# Patient Record
Sex: Female | Born: 1995 | Race: Black or African American | Hispanic: No | Marital: Single | Smoking: Current every day smoker
Health system: Southern US, Community
[De-identification: ages and names within clinical notes are randomized; demographics above are authoritative.]

---

## 2014-05-29 ENCOUNTER — Encounter (HOSPITAL_BASED_OUTPATIENT_CLINIC_OR_DEPARTMENT_OTHER): Payer: Self-pay | Admitting: *Deleted

## 2014-05-29 ENCOUNTER — Emergency Department (HOSPITAL_BASED_OUTPATIENT_CLINIC_OR_DEPARTMENT_OTHER)
Admission: EM | Admit: 2014-05-29 | Discharge: 2014-05-29 | Disposition: A | Discharge status: Home / Self Care | Payer: Medicaid Other | Attending: Emergency Medicine | Admitting: Emergency Medicine

## 2014-05-29 DIAGNOSIS — K5904 Chronic idiopathic constipation: Secondary | ICD-10-CM

## 2014-05-29 DIAGNOSIS — K5900 Constipation, unspecified: Secondary | ICD-10-CM | POA: Diagnosis not present

## 2014-05-29 DIAGNOSIS — Z720 Tobacco use: Secondary | ICD-10-CM | POA: Insufficient documentation

## 2014-05-29 DIAGNOSIS — R1030 Lower abdominal pain, unspecified: Secondary | ICD-10-CM | POA: Diagnosis present

## 2014-05-29 DIAGNOSIS — Z3202 Encounter for pregnancy test, result negative: Secondary | ICD-10-CM | POA: Diagnosis not present

## 2014-05-29 LAB — URINALYSIS, ROUTINE W REFLEX MICROSCOPIC
BILIRUBIN URINE: NEGATIVE
Glucose, UA: NEGATIVE mg/dL
Hgb urine dipstick: NEGATIVE
Ketones, ur: NEGATIVE mg/dL
Nitrite: NEGATIVE
PH: 7 (ref 5.0–8.0)
Protein, ur: NEGATIVE mg/dL
Specific Gravity, Urine: 1.023 (ref 1.005–1.030)
Urobilinogen, UA: 0.2 mg/dL (ref 0.0–1.0)

## 2014-05-29 LAB — URINE MICROSCOPIC-ADD ON

## 2014-05-29 LAB — PREGNANCY, URINE: Preg Test, Ur: NEGATIVE

## 2014-05-29 MED ORDER — IBUPROFEN 800 MG PO TABS
800.0000 mg | ORAL_TABLET | Freq: Once | ORAL | Status: AC
  Administered 2014-05-29: 800 mg via ORAL
  Filled 2014-05-29: qty 1

## 2014-05-29 MED ORDER — DICYCLOMINE HCL 10 MG PO CAPS
10.0000 mg | ORAL_CAPSULE | Freq: Once | ORAL | Status: AC
  Administered 2014-05-29: 10 mg via ORAL
  Filled 2014-05-29: qty 1

## 2014-05-29 MED ORDER — METOCLOPRAMIDE HCL 10 MG PO TABS
10.0000 mg | ORAL_TABLET | Freq: Once | ORAL | Status: AC
  Administered 2014-05-29: 10 mg via ORAL
  Filled 2014-05-29: qty 1

## 2014-05-29 NOTE — ED Notes (Signed)
Pt is here for lower abdominal pain and headache.  No GU or GYN symptoms with this, no n/v or diarrhea.  Symptoms x3 days

## 2014-05-29 NOTE — ED Provider Notes (Signed)
CSN: [id number]     Arrival date & time 05/29/14  0052 History   First MD Initiated Contact with Patient 05/29/14 0145     Chief Complaint  Patient presents with  . Abdominal Pain     (Consider location/radiation/quality/duration/timing/severity/associated sxs/prior Treatment) Patient is a 19 y.o. female presenting with abdominal pain. The history is provided by the patient.  Abdominal Pain Pain location:  Suprapubic Pain quality: cramping   Pain radiates to:  Does not radiate Pain severity:  Mild Onset quality:  Gradual Timing:  Intermittent Progression:  Unchanged Chronicity:  New Context comment:  Passing four stools of normal consistency.  No GU norGYN complaints Relieved by:  Nothing Worsened by:  Nothing tried Ineffective treatments:  None tried Associated symptoms: no anorexia, no dysuria, no vaginal bleeding and no vaginal discharge   Risk factors: not pregnant     History reviewed. No pertinent past medical history. History reviewed. No pertinent past surgical history. History reviewed. No pertinent family history. History  Substance Use Topics  . Smoking status: Current Every Day Smoker -- 0.00 packs/day  . Smokeless tobacco: Not on file  . Alcohol Use: No   OB History    No data available     Review of Systems  Gastrointestinal: Positive for abdominal pain. Negative for anorexia.  Genitourinary: Negative for dysuria, vaginal bleeding and vaginal discharge.  All other systems reviewed and are negative.     Allergies  Review of patient's allergies indicates no known allergies.  Home Medications   Prior to Admission medications   Not on File   BP 126/81 mmHg  Pulse 93  Temp(Src) 98.4 F (36.9 C) (Oral)  Resp 16  Wt 201 lb 1.6 oz (91.218 kg)  SpO2 99%  LMP 05/06/2014 Physical Exam  Constitutional: She is oriented to person, place, and time. She appears well-developed and well-nourished. No distress.  HENT:  Head: Normocephalic and atraumatic.   Mouth/Throat: Oropharynx is clear and moist.  Eyes: Conjunctivae are normal. Pupils are equal, round, and reactive to light.  Neck: Normal range of motion. Neck supple.  Cardiovascular: Normal rate, regular rhythm and intact distal pulses.   Pulmonary/Chest: Effort normal and breath sounds normal. She has no wheezes. She has no rales.  Abdominal: Soft. She exhibits no mass. Bowel sounds are increased. There is no tenderness. There is no rigidity, no rebound, no guarding, no tenderness at McBurney's point and negative Murphy's sign.  Palpable stool throughout the colon  Musculoskeletal: Normal range of motion.  Neurological: She is alert and oriented to person, place, and time. She has normal reflexes. No cranial nerve deficit.  Skin: Skin is warm and dry.  Psychiatric: She has a normal mood and affect.    ED Course  Procedures (including critical care time) Labs Review Labs Reviewed  URINALYSIS, ROUTINE W REFLEX MICROSCOPIC - Abnormal; Notable for the following:    Leukocytes, UA SMALL (*)    All other components within normal limits  URINE MICROSCOPIC-ADD ON - Abnormal; Notable for the following:    Bacteria, UA FEW (*)    All other components within normal limits  PREGNANCY, URINE    Imaging Review No results found.   EKG Interpretation None      MDM   Final diagnoses:  Constipation - functional  Here with several other family    Patient repeatedly asked for a urine pregnancy result.  This is negative.  Palpable constipation recommend miralax.  Feels improved in the ED.  Patient is stable for  discharge at this time    Cooper Stamp Smitty Cords, MD 05/29/14 [phone]

## 2014-05-29 NOTE — Discharge Instructions (Signed)

## 2014-10-13 ENCOUNTER — Encounter (HOSPITAL_BASED_OUTPATIENT_CLINIC_OR_DEPARTMENT_OTHER): Payer: Self-pay | Admitting: *Deleted

## 2014-10-13 ENCOUNTER — Emergency Department (HOSPITAL_BASED_OUTPATIENT_CLINIC_OR_DEPARTMENT_OTHER)
Admission: EM | Admit: 2014-10-13 | Discharge: 2014-10-13 | Disposition: A | Discharge status: Home / Self Care | Payer: Medicaid Other | Attending: Emergency Medicine | Admitting: Emergency Medicine

## 2014-10-13 DIAGNOSIS — N3091 Cystitis, unspecified with hematuria: Secondary | ICD-10-CM | POA: Insufficient documentation

## 2014-10-13 DIAGNOSIS — N3090 Cystitis, unspecified without hematuria: Secondary | ICD-10-CM

## 2014-10-13 DIAGNOSIS — Z3202 Encounter for pregnancy test, result negative: Secondary | ICD-10-CM | POA: Insufficient documentation

## 2014-10-13 DIAGNOSIS — R569 Unspecified convulsions: Secondary | ICD-10-CM | POA: Insufficient documentation

## 2014-10-13 LAB — URINALYSIS, ROUTINE W REFLEX MICROSCOPIC
BILIRUBIN URINE: NEGATIVE
Glucose, UA: NEGATIVE mg/dL
HGB URINE DIPSTICK: NEGATIVE
KETONES UR: 15 mg/dL — AB
Nitrite: NEGATIVE
PH: 6 (ref 5.0–8.0)
Protein, ur: NEGATIVE mg/dL
SPECIFIC GRAVITY, URINE: 1.027 (ref 1.005–1.030)
UROBILINOGEN UA: 0.2 mg/dL (ref 0.0–1.0)

## 2014-10-13 LAB — PREGNANCY, URINE: PREG TEST UR: NEGATIVE

## 2014-10-13 LAB — URINE MICROSCOPIC-ADD ON

## 2014-10-13 MED ORDER — CIPROFLOXACIN HCL 250 MG PO TABS: 250.0000 mg | ORAL_TABLET | Freq: Two times a day (BID) | ORAL | Status: DC

## 2014-10-13 NOTE — ED Notes (Signed)
Dysuria. Lower abdominal pain and frequency x 2 days. No vaginal discharge.

## 2014-10-13 NOTE — ED Notes (Signed)
Pt directed to pharmacy to pick up meds 

## 2014-10-13 NOTE — Discharge Instructions (Signed)

## 2014-10-13 NOTE — ED Provider Notes (Signed)
CSN: [id number]     Arrival date & time 10/13/14  1100 History   First MD Initiated Contact with Patient 10/13/14 1110     Chief Complaint  Patient presents with  . Dysuria     (Consider location/radiation/quality/duration/timing/severity/associated sxs/prior Treatment) HPI Comments: Pt. Is a 19 y/o AAF here with dysuria, hematuria, frequency, urgency, and low abdominal pain for the past two days. These symptoms are consistent with a previous urinary tract infection that she had. She denies fever, chills, nausea, vomiting. She says she saw a small amount of blood on the tissue with wiping, but no blood in the bowl. She has not been on her period. She has not had constipation or diarrhea. She has not had vaginal discharge, pain, itching, or burning. She has no other medical problems.   The history is provided by the patient.    History reviewed. No pertinent past medical history. History reviewed. No pertinent past surgical history. No family history on file. History  Substance Use Topics  . Smoking status: Never Smoker   . Smokeless tobacco: Not on file  . Alcohol Use: Yes     Comment: occ   OB History    No data available     Review of Systems  Constitutional: Negative.  Negative for fever, chills and fatigue.  HENT: Negative.   Eyes: Negative.   Respiratory: Negative.   Cardiovascular: Negative for chest pain, palpitations and leg swelling.  Gastrointestinal: Positive for abdominal pain. Negative for nausea, vomiting, diarrhea, constipation and abdominal distention.  Endocrine: Positive for polyuria. Negative for polydipsia.  Genitourinary: Positive for dysuria, urgency, frequency, hematuria and flank pain. Negative for decreased urine volume, vaginal bleeding, vaginal discharge, difficulty urinating, vaginal pain, menstrual problem, pelvic pain and dyspareunia.  Musculoskeletal: Positive for back pain. Negative for myalgias.  Skin: Negative for color change and rash.   Allergic/Immunologic: Negative.   Neurological: Positive for seizures. Negative for dizziness, light-headedness, numbness and headaches.  Hematological: Negative.   Psychiatric/Behavioral: Negative.       Allergies  Review of patient's allergies indicates no known allergies.  Home Medications   Prior to Admission medications   Medication Sig Start Date End Date Taking? Authorizing Provider  ciprofloxacin (CIPRO) 250 MG tablet Take 1 tablet (250 mg total) by mouth 2 (two) times daily. 10/13/14   Hillery Hunter Raequan Vanschaick, MD   BP 114/73 mmHg  Pulse 68  Temp(Src) 98 F (36.7 C) (Oral)  Resp 20  Ht  (1.702 m)  Wt 201 lb (91.173 kg)  BMI 31.47 kg/m2  SpO2 100%  LMP 09/11/2014 Physical Exam  Constitutional: She is oriented to person, place, and time. She appears well-developed and well-nourished. No distress.  HENT:  Head: Normocephalic and atraumatic.  Eyes: Conjunctivae and EOM are normal. Pupils are equal, round, and reactive to light.  Neck: Normal range of motion. Neck supple.  Cardiovascular: Normal rate, regular rhythm, normal heart sounds and intact distal pulses.  Exam reveals no gallop and no friction rub.   No murmur heard. Pulmonary/Chest: Effort normal and breath sounds normal. No respiratory distress. She has no wheezes. She has no rales. She exhibits no tenderness.  Abdominal: Soft. Bowel sounds are normal. She exhibits no distension and no mass. There is no hepatosplenomegaly. There is tenderness in the suprapubic area. There is CVA tenderness. There is no rebound and no guarding.  Very Mild CVA tenderness on the right.   Musculoskeletal: Normal range of motion. She exhibits no edema or tenderness.  Neurological: She  is alert and oriented to person, place, and time.  Skin: Skin is warm and dry. No rash noted. She is not diaphoretic. No erythema.  Psychiatric: She has a normal mood and affect. Her behavior is normal.    ED Course  Procedures (including critical  care time) Labs Review Labs Reviewed  URINALYSIS, ROUTINE W REFLEX MICROSCOPIC (NOT AT [facility]) - Abnormal; Notable for the following:    APPearance CLOUDY (*)    Ketones, ur 15 (*)    Leukocytes, UA MODERATE (*)    All other components within normal limits  URINE MICROSCOPIC-ADD ON - Abnormal; Notable for the following:    Bacteria, UA MANY (*)    All other components within normal limits  URINE CULTURE  PREGNANCY, URINE    Imaging Review No results found.   EKG Interpretation None      MDM   Final diagnoses:  Cystitis    Pt. Is a 19 y/o F here with uncomplicated cystits vs. Early pyelonephritis. No fever, no nausea or vomiting. Urinalysis with evidence of urinary tract infection. Culture sent. Will give Ciprofloxacin for Cystitis vs. Early pyelonephritis and d/c home with recommendation to follow up with PCP as an outpatient.     Yolande Jollyaleb G Myldred Raju, MD 10/13/14 1237  Elwin MochaBlair Walden, MD 10/13/14 [phone]

## 2014-10-15 LAB — URINE CULTURE: SPECIAL REQUESTS: NORMAL

## 2015-10-15 ENCOUNTER — Emergency Department (HOSPITAL_BASED_OUTPATIENT_CLINIC_OR_DEPARTMENT_OTHER): Payer: Self-pay

## 2015-10-15 ENCOUNTER — Encounter (HOSPITAL_BASED_OUTPATIENT_CLINIC_OR_DEPARTMENT_OTHER): Payer: Self-pay | Admitting: *Deleted

## 2015-10-15 ENCOUNTER — Emergency Department (HOSPITAL_BASED_OUTPATIENT_CLINIC_OR_DEPARTMENT_OTHER)
Admission: EM | Admit: 2015-10-15 | Discharge: 2015-10-15 | Disposition: A | Discharge status: Home / Self Care | Payer: Self-pay | Attending: Emergency Medicine | Admitting: Emergency Medicine

## 2015-10-15 DIAGNOSIS — S61411A Laceration without foreign body of right hand, initial encounter: Secondary | ICD-10-CM | POA: Insufficient documentation

## 2015-10-15 DIAGNOSIS — Y9229 Other specified public building as the place of occurrence of the external cause: Secondary | ICD-10-CM | POA: Insufficient documentation

## 2015-10-15 DIAGNOSIS — W25XXXA Contact with sharp glass, initial encounter: Secondary | ICD-10-CM | POA: Insufficient documentation

## 2015-10-15 DIAGNOSIS — Y939 Activity, unspecified: Secondary | ICD-10-CM | POA: Insufficient documentation

## 2015-10-15 DIAGNOSIS — Y999 Unspecified external cause status: Secondary | ICD-10-CM | POA: Insufficient documentation

## 2015-10-15 LAB — PREGNANCY, URINE: Preg Test, Ur: NEGATIVE

## 2015-10-15 MED ORDER — AMOXICILLIN-POT CLAVULANATE 875-125 MG PO TABS: 1.0000 | ORAL_TABLET | Freq: Two times a day (BID) | ORAL | Status: DC

## 2015-10-15 MED ORDER — LIDOCAINE HCL 2 % IJ SOLN
10.0000 mL | Freq: Once | INTRAMUSCULAR | Status: AC
  Administered 2015-10-15: 200 mg
  Filled 2015-10-15: qty 20

## 2015-10-15 MED ORDER — DICLOFENAC SODIUM ER 100 MG PO TB24: 100.0000 mg | ORAL_TABLET | Freq: Every day | ORAL | Status: DC

## 2015-10-15 MED ORDER — NAPROXEN 250 MG PO TABS
500.0000 mg | ORAL_TABLET | Freq: Once | ORAL | Status: AC
  Administered 2015-10-15: 500 mg via ORAL
  Filled 2015-10-15: qty 2

## 2015-10-15 MED ORDER — TETANUS-DIPHTH-ACELL PERTUSSIS 5-2.5-18.5 LF-MCG/0.5 IM SUSP
0.5000 mL | Freq: Once | INTRAMUSCULAR | Status: AC
  Administered 2015-10-15: 0.5 mL via INTRAMUSCULAR
  Filled 2015-10-15: qty 0.5

## 2015-10-15 MED ORDER — AMOXICILLIN-POT CLAVULANATE 875-125 MG PO TABS
1.0000 | ORAL_TABLET | Freq: Once | ORAL | Status: AC
  Administered 2015-10-15: 1 via ORAL
  Filled 2015-10-15: qty 1

## 2015-10-15 NOTE — ED Notes (Addendum)
MD at bedside suturing
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
## 2015-10-15 NOTE — ED Notes (Signed)
Non-sutured wounds covered with bacitracin. All wounds covered with non-adherent dressing, padded with gauze and wrapped with kerlex. Pt verbalized understanding in how to redress wound tomorrow. Wound dressing supplies provided.
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
## 2015-10-15 NOTE — ED Notes (Signed)
Patient transported to X-ray 

## 2015-10-15 NOTE — Discharge Instructions (Signed)
Laceration Care, Adult  A laceration is a cut that goes through all layers of the skin. The cut also goes into the tissue that is right under the skin. Some cuts heal on their own. Others need to be closed with stitches (sutures), staples, skin adhesive strips, or wound glue. Taking care of your cut lowers your risk of infection and helps your cut to heal better.  HOW TO TAKE CARE OF YOUR CUT  For stitches or staples:  · Keep the wound clean and dry.  · If you were given a bandage (dressing), you should change it at least one time per day or as told by your doctor. You should also change it if it gets wet or dirty.  · Keep the wound completely dry for the first 24 hours or as told by your doctor. After that time, you may take a shower or a bath. However, make sure that the wound is not soaked in water until after the stitches or staples have been removed.  · Clean the wound one time each day or as told by your doctor:    Wash the wound with soap and water.    Rinse the wound with water until all of the soap comes off.    Pat the wound dry with a clean towel. Do not rub the wound.  · After you clean the wound, put a thin layer of antibiotic ointment on it as told by your doctor. This ointment:    Helps to prevent infection.    Keeps the bandage from sticking to the wound.  · Have your stitches or staples removed as told by your doctor.  If your doctor used skin adhesive strips:   · Keep the wound clean and dry.  · If you were given a bandage, you should change it at least one time per day or as told by your doctor. You should also change it if it gets dirty or wet.  · Do not get the skin adhesive strips wet. You can take a shower or a bath, but be careful to keep the wound dry.  · If the wound gets wet, pat it dry with a clean towel. Do not rub the wound.  · Skin adhesive strips fall off on their own. You can trim the strips as the wound heals. Do not remove any strips that are still stuck to the wound. They will  fall off after a while.  If your doctor used wound glue:  · Try to keep your wound dry, but you may briefly wet it in the shower or bath. Do not soak the wound in water, such as by swimming.  · After you take a shower or a bath, gently pat the wound dry with a clean towel. Do not rub the wound.  · Do not do any activities that will make you really sweaty until the skin glue has fallen off on its own.  · Do not apply liquid, cream, or ointment medicine to your wound while the skin glue is still on.  · If you were given a bandage, you should change it at least one time per day or as told by your doctor. You should also change it if it gets dirty or wet.  · If a bandage is placed over the wound, do not let the tape for the bandage touch the skin glue.  · Do not pick at the glue. The skin glue usually stays on for 5-10 days. Then, it   falls off of the skin.  General Instructions   · To help prevent scarring, make sure to cover your wound with sunscreen whenever you are outside after stitches are removed, after adhesive strips are removed, or when wound glue stays in place and the wound is healed. Make sure to wear a sunscreen of at least 30 SPF.  · Take over-the-counter and prescription medicines only as told by your doctor.  · If you were given antibiotic medicine or ointment, take or apply it as told by your doctor. Do not stop using the antibiotic even if your wound is getting better.  · Do not scratch or pick at the wound.  · Keep all follow-up visits as told by your doctor. This is important.  · Check your wound every day for signs of infection. Watch for:    Redness, swelling, or pain.    Fluid, blood, or pus.  · Raise (elevate) the injured area above the level of your heart while you are sitting or lying down, if possible.  GET HELP IF:  · You got a tetanus shot and you have any of these problems at the injection site:    Swelling.    Very bad pain.    Redness.    Bleeding.  · You have a fever.  · A wound that was  closed breaks open.  · You notice a bad smell coming from your wound or your bandage.  · You notice something coming out of the wound, such as wood or glass.  · Medicine does not help your pain.  · You have more redness, swelling, or pain at the site of your wound.  · You have fluid, blood, or pus coming from your wound.  · You notice a change in the color of your skin near your wound.  · You need to change the bandage often because fluid, blood, or pus is coming from the wound.  · You start to have a new rash.  · You start to have numbness around the wound.  GET HELP RIGHT AWAY IF:  · You have very bad swelling around the wound.  · Your pain suddenly gets worse and is very bad.  · You notice painful lumps near the wound or on skin that is anywhere on your body.  · You have a red streak going away from your wound.  · The wound is on your hand or foot and you cannot move a finger or toe like you usually can.  · The wound is on your hand or foot and you notice that your fingers or toes look pale or bluish.     This information is not intended to replace advice given to you by your health care provider. Make sure you discuss any questions you have with your health care provider.     Document Released: 09/04/2007 Document Revised: 08/02/2014 Document Reviewed: 03/14/2014  [employer] Interactive Patient Education ©2016 [employer]

## 2015-12-07 ENCOUNTER — Encounter (HOSPITAL_BASED_OUTPATIENT_CLINIC_OR_DEPARTMENT_OTHER): Payer: Self-pay | Admitting: *Deleted

## 2015-12-07 ENCOUNTER — Emergency Department (HOSPITAL_BASED_OUTPATIENT_CLINIC_OR_DEPARTMENT_OTHER)
Admission: EM | Admit: 2015-12-07 | Discharge: 2015-12-07 | Disposition: A | Discharge status: Home / Self Care | Payer: Medicaid Other | Attending: Emergency Medicine | Admitting: Emergency Medicine

## 2015-12-07 DIAGNOSIS — B373 Candidiasis of vulva and vagina: Secondary | ICD-10-CM | POA: Insufficient documentation

## 2015-12-07 DIAGNOSIS — B3731 Acute candidiasis of vulva and vagina: Secondary | ICD-10-CM

## 2015-12-07 DIAGNOSIS — F17200 Nicotine dependence, unspecified, uncomplicated: Secondary | ICD-10-CM | POA: Insufficient documentation

## 2015-12-07 LAB — WET PREP, GENITAL
Clue Cells Wet Prep HPF POC: NONE SEEN
Sperm: NONE SEEN
TRICH WET PREP: NONE SEEN

## 2015-12-07 LAB — URINALYSIS, ROUTINE W REFLEX MICROSCOPIC
Bilirubin Urine: NEGATIVE
GLUCOSE, UA: NEGATIVE mg/dL
Hgb urine dipstick: NEGATIVE
Ketones, ur: NEGATIVE mg/dL
Nitrite: NEGATIVE
Protein, ur: NEGATIVE mg/dL
SPECIFIC GRAVITY, URINE: 1.026 (ref 1.005–1.030)
pH: 6.5 (ref 5.0–8.0)

## 2015-12-07 LAB — URINE MICROSCOPIC-ADD ON

## 2015-12-07 LAB — PREGNANCY, URINE: PREG TEST UR: NEGATIVE

## 2015-12-07 MED ORDER — FLUCONAZOLE 50 MG PO TABS
150.0000 mg | ORAL_TABLET | Freq: Every day | ORAL | Status: DC
  Administered 2015-12-07: 150 mg via ORAL
  Filled 2015-12-07: qty 1

## 2015-12-07 NOTE — ED Provider Notes (Signed)
[facility] Provider Note   CSN: [id number] Arrival date & time: 12/07/15  1959     History   Chief Complaint Chief Complaint  Patient presents with  . Vaginal Pain    HPI Christina Maddox is a 20 y.o. female with recent treatment for trichomonas presenting for vaginal discharge and itching. Patient states that she's had 2 day history of vaginal discharge and itching. Vaginal pain from itching. She states that she has white vaginal discharge. She recently treated for Trichomonas received metronidazole and took this to completion. Denies dysuria, freq, urgency. Denies fevers, chills, nausea, or vomiting.  Have not been sexual active in the last two weeks, with previous partner whom she had been active with.   HPI  History reviewed. No pertinent past medical history.  There are no active problems to display for this patient.   History reviewed. No pertinent surgical history.  OB History    No data available       Home Medications    Prior to Admission medications   Medication Sig Start Date End Date Taking? Authorizing Provider  amoxicillin-clavulanate (AUGMENTIN) 875-125 MG tablet Take 1 tablet by mouth 2 (two) times daily. One po bid x 7 days 10/15/15   April Palumbo, MD  ciprofloxacin (CIPRO) 250 MG tablet Take 1 tablet (250 mg total) by mouth 2 (two) times daily. 10/13/14   Yolande Jollyaleb G Melancon, MD  Diclofenac Sodium CR (VOLTAREN-XR) 100 MG 24 hr tablet Take 1 tablet (100 mg total) by mouth daily. 10/15/15   April Palumbo, MD    Family History No family history on file.  Social History Social History  Substance Use Topics  . Smoking status: Current Every Day Smoker    Packs/day: 0.00  . Smokeless tobacco: Never Used  . Alcohol use Yes     Comment: occ     Allergies   Review of patient's allergies indicates no known allergies.   Review of Systems Review of Systems  Constitutional: Negative for chills and fever.  HENT: Negative for congestion.     Eyes: Negative for visual disturbance.  Respiratory: Negative for shortness of breath.   Gastrointestinal: Negative for abdominal distention, abdominal pain, nausea and vomiting.  Genitourinary: Positive for vaginal discharge and vaginal pain. Negative for dysuria, frequency and urgency.  Neurological: Negative for dizziness.     Physical Exam Updated Vital Signs BP 120/65 (BP Location: Right Arm)   Pulse 88   Temp 97.4 F (36.3 C) (Oral)   Resp 18   Ht 5\' 8"  (1.727 m)   Wt 89.8 kg   LMP 11/22/2015   SpO2 100%   BMI 30.11 kg/m   Physical Exam  Constitutional: She is oriented to person, place, and time. She appears well-developed and well-nourished.  HENT:  Head: Normocephalic and atraumatic.  Mouth/Throat: Oropharynx is clear and moist.  Eyes: Conjunctivae are normal.  Neck: Normal range of motion. Neck supple.  Cardiovascular: Normal rate, regular rhythm, normal heart sounds and intact distal pulses.   Pulmonary/Chest: Effort normal and breath sounds normal.  Abdominal: Soft. Bowel sounds are normal.  Genitourinary: Uterus normal. Cervix exhibits no motion tenderness and no discharge. Right adnexum displays no mass and no tenderness. Left adnexum displays no mass and no tenderness. No tenderness in the vagina. Vaginal discharge found.  Musculoskeletal: Normal range of motion.  Neurological: She is alert and oriented to person, place, and time.  Skin: Skin is warm and dry.  Psychiatric: She has a normal mood and affect.  ED Treatments / Results  Labs (all labs ordered are listed, but only abnormal results are displayed) Labs Reviewed  WET PREP, GENITAL - Abnormal; Notable for the following:       Result Value   Yeast Wet Prep HPF POC PRESENT (*)    WBC, Wet Prep HPF POC MANY (*)    All other components within normal limits  URINALYSIS, ROUTINE W REFLEX MICROSCOPIC (NOT AT [facility]) - Abnormal; Notable for the following:    APPearance CLOUDY (*)    Leukocytes, UA  LARGE (*)    All other components within normal limits  URINE MICROSCOPIC-ADD ON - Abnormal; Notable for the following:    Squamous Epithelial / LPF 0-5 (*)    Bacteria, UA RARE (*)    All other components within normal limits  PREGNANCY, URINE  RPR  HIV ANTIBODY (ROUTINE TESTING)  GC/CHLAMYDIA PROBE AMP ([place]) NOT AT [facility]    EKG  EKG Interpretation None       Radiology No results found.  Procedures Procedures (including critical care time)  Medications Ordered in ED Medications  fluconazole (DIFLUCAN) tablet 150 mg (150 mg Oral Given 12/07/15 2213)     Initial Impression / Assessment and Plan / ED Course  I have reviewed the triage vital signs and the nursing notes.  Pertinent labs & imaging results that were available during my care of the patient were reviewed by me and considered in my medical decision making (see chart for details).  Clinical Course   Patient presenting with 3 day history of vaginal discharge and irritation, recent STD treatment about 2 weeks ago. No signs of systemic symptoms.  Obtained a wet prep, GC, HIV, RPR. Positive for yeast on wet prep, provided treatment.   Final Clinical Impressions(s) / ED Diagnoses   Final diagnoses:  Vaginal candida    New Prescriptions Discharge Medication List as of 12/07/2015  9:59 PM       Chevi Lim Mayra Reel, MD 12/07/15 2324    Rolan Bucco, MD 12/07/15 2327

## 2015-12-07 NOTE — ED Notes (Signed)
Patient refusing further lab draws

## 2015-12-07 NOTE — ED Triage Notes (Signed)
States she was [facility] 2 weeks ago. She continues to have vaginal pain and discharge.

## 2015-12-08 LAB — GC/CHLAMYDIA PROBE AMP (~~LOC~~) NOT AT ARMC
Chlamydia: NEGATIVE
Neisseria Gonorrhea: NEGATIVE

## 2016-01-23 ENCOUNTER — Encounter (HOSPITAL_BASED_OUTPATIENT_CLINIC_OR_DEPARTMENT_OTHER): Payer: Self-pay | Admitting: *Deleted

## 2016-01-23 ENCOUNTER — Emergency Department (HOSPITAL_BASED_OUTPATIENT_CLINIC_OR_DEPARTMENT_OTHER)
Admission: EM | Admit: 2016-01-23 | Discharge: 2016-01-23 | Disposition: A | Discharge status: Home / Self Care | Payer: Medicaid Other | Attending: Emergency Medicine | Admitting: Emergency Medicine

## 2016-01-23 DIAGNOSIS — Z79899 Other long term (current) drug therapy: Secondary | ICD-10-CM | POA: Insufficient documentation

## 2016-01-23 DIAGNOSIS — F17200 Nicotine dependence, unspecified, uncomplicated: Secondary | ICD-10-CM | POA: Insufficient documentation

## 2016-01-23 DIAGNOSIS — B373 Candidiasis of vulva and vagina: Secondary | ICD-10-CM | POA: Insufficient documentation

## 2016-01-23 DIAGNOSIS — B3731 Acute candidiasis of vulva and vagina: Secondary | ICD-10-CM

## 2016-01-23 LAB — PREGNANCY, URINE: Preg Test, Ur: NEGATIVE

## 2016-01-23 LAB — URINE MICROSCOPIC-ADD ON

## 2016-01-23 LAB — URINALYSIS, ROUTINE W REFLEX MICROSCOPIC
Bilirubin Urine: NEGATIVE
Glucose, UA: NEGATIVE mg/dL
HGB URINE DIPSTICK: NEGATIVE
KETONES UR: NEGATIVE mg/dL
Nitrite: NEGATIVE
PROTEIN: NEGATIVE mg/dL
Specific Gravity, Urine: 1.024 (ref 1.005–1.030)
pH: 6.5 (ref 5.0–8.0)

## 2016-01-23 MED ORDER — FLUCONAZOLE 50 MG PO TABS
150.0000 mg | ORAL_TABLET | Freq: Once | ORAL | Status: AC
  Administered 2016-01-23: 150 mg via ORAL
  Filled 2016-01-23: qty 1

## 2016-01-23 MED ORDER — FLUCONAZOLE 150 MG PO TABS: 150.0000 mg | ORAL_TABLET | Freq: Once | ORAL | 0 refills | Status: AC

## 2016-01-23 NOTE — ED Provider Notes (Signed)
[facility]   CSN: [id number] Arrival date & time: 01/23/16  1224     History   Chief Complaint Chief Complaint  Patient presents with  . Vaginal Itching    HPI Christina Maddox is a 20 y.o. female who presents to the ED with complaints of vaginal itching 3 days. Symptoms improve with showers, no other treatments tried, and worsens with wearing underwear. She states that just prior to onset of symptoms, she changed her soap and wore clothing that was washed with a different detergent than she is used to. Denies any recent antibiotic use or feminine hygiene product changes. Denies douching. She is sexually active with one female partner, unprotected. LMP was one month ago. She denies any vaginal discharge, dysuria, hematuria, fevers, chills, chest pain, shortness breath, abdominal pain, nausea, vomiting, diarrhea, constipation, vaginal bleeding, genital sores, numbness, tingling, or focal weakness. Denies any other symptoms at this time. Chart review reveals she was seen in 12/2015 for vaginal complaints, had STD check that was neg at that time.    The history is provided by the patient and medical records. No language interpreter was used.  Vaginal Itching  This is a new problem. The current episode started more than 2 days ago. The problem occurs constantly. The problem has not changed since onset.Pertinent negatives include no chest pain, no abdominal pain and no shortness of breath. Exacerbated by: wearing underwear. Relieved by: taking showers. Treatments tried: showers. The treatment provided moderate relief.    History reviewed. No pertinent past medical history.  There are no active problems to display for this patient.   History reviewed. No pertinent surgical history.  OB History    No data available       Home Medications    Prior to Admission medications   Medication Sig Start Date End Date Taking? Authorizing Provider    amoxicillin-clavulanate (AUGMENTIN) 875-125 MG tablet Take 1 tablet by mouth 2 (two) times daily. One po bid x 7 days 10/15/15   April Palumbo, MD  ciprofloxacin (CIPRO) 250 MG tablet Take 1 tablet (250 mg total) by mouth 2 (two) times daily. 10/13/14   Yolande Jollyaleb G Melancon, MD  Diclofenac Sodium CR (VOLTAREN-XR) 100 MG 24 hr tablet Take 1 tablet (100 mg total) by mouth daily. 10/15/15   April Palumbo, MD    Family History No family history on file.  Social History Social History  Substance Use Topics  . Smoking status: Current Every Day Smoker    Packs/day: 0.00  . Smokeless tobacco: Never Used  . Alcohol use Yes     Comment: occ     Allergies   Review of patient's allergies indicates no known allergies.   Review of Systems Review of Systems  Constitutional: Negative for chills and fever.  Respiratory: Negative for shortness of breath.   Cardiovascular: Negative for chest pain.  Gastrointestinal: Negative for abdominal pain, constipation, diarrhea, nausea and vomiting.  Genitourinary: Positive for vaginal pain (itching). Negative for dysuria, genital sores, hematuria, vaginal bleeding and vaginal discharge.  Musculoskeletal: Negative for arthralgias and myalgias.  Skin: Negative for color change.  Allergic/Immunologic: Negative for immunocompromised state.  Neurological: Negative for weakness and numbness.  Psychiatric/Behavioral: Negative for confusion.   10 Systems reviewed and are negative for acute change except as noted in the HPI.   Physical Exam Updated Vital Signs BP 110/66   Pulse 87   Temp 98.1 F (36.7 C) (Oral)   Resp 18   Ht 5\' 7"  (1.702  m)   Wt 89.8 kg   LMP 12/22/2015   SpO2 100%   BMI 31.01 kg/m   Physical Exam  Constitutional: She is oriented to person, place, and time. Vital signs are normal. She appears well-developed and well-nourished.  Non-toxic appearance. No distress.  Afebrile, nontoxic, NAD  HENT:  Head: Normocephalic and atraumatic.   Mouth/Throat: Oropharynx is clear and moist and mucous membranes are normal.  Eyes: Conjunctivae and EOM are normal. Right eye exhibits no discharge. Left eye exhibits no discharge.  Neck: Normal range of motion. Neck supple.  Cardiovascular: Normal rate, regular rhythm, normal heart sounds and intact distal pulses.  Exam reveals no gallop and no friction rub.   No murmur heard. Pulmonary/Chest: Effort normal and breath sounds normal. No respiratory distress. She has no decreased breath sounds. She has no wheezes. She has no rhonchi. She has no rales.  Abdominal: Soft. Normal appearance and bowel sounds are normal. She exhibits no distension. There is no tenderness. There is no rigidity, no rebound, no guarding, no CVA tenderness, no tenderness at McBurney's point and negative Murphy's sign.  Soft, NTND, +BS throughout, no r/g/r, neg murphy's, neg mcburney's, no CVA TTP   Musculoskeletal: Normal range of motion.  Neurological: She is alert and oriented to person, place, and time. She has normal strength. No sensory deficit.  Skin: Skin is warm, dry and intact. No rash noted.  Psychiatric: She has a normal mood and affect.  Nursing note and vitals reviewed.    ED Treatments / Results  Labs (all labs ordered are listed, but only abnormal results are displayed) Labs Reviewed  URINALYSIS, ROUTINE W REFLEX MICROSCOPIC (NOT AT [facility]) - Abnormal; Notable for the following:       Result Value   APPearance CLOUDY (*)    Leukocytes, UA LARGE (*)    All other components within normal limits  URINE MICROSCOPIC-ADD ON - Abnormal; Notable for the following:    Squamous Epithelial / LPF 6-30 (*)    Bacteria, UA FEW (*)    All other components within normal limits  PREGNANCY, URINE   GC/CT testing 12/2015: negative  EKG  EKG Interpretation None       Radiology No results found.  Procedures Procedures (including critical care time)  Medications Ordered in ED Medications  fluconazole  (DIFLUCAN) tablet 150 mg (not administered)     Initial Impression / Assessment and Plan / ED Course  I have reviewed the triage vital signs and the nursing notes.  Pertinent labs & imaging results that were available during my care of the patient were reviewed by me and considered in my medical decision making (see chart for details).  Clinical Course    20 y.o. female here with vaginal itching x3 days. Recent change in soap/detergent. No discharge, no abd pain or tenderness, no urinary symptoms. Abd exam benign. Upreg neg, U/A contaminated with 6-30 squamous, large leuks no nitrites and few bacteria, and +yeast present. Doubt UTI, symptoms likely all from yeast vaginitis. Doubt need for pelvic given that she has no pain and we have an answer for her symptoms on the urine sample. Already had STD testing last month which was neg, doubt need to repeat this. Diflucan here given, and one tab rx for if symptoms persist in 3 days. F/up with PCP in 1wk. Discussed switching back to prior soap/detergent. I explained the diagnosis and have given explicit precautions to return to the ER including for any other new or worsening symptoms. The patient  understands and accepts the medical plan as it's been dictated and I have answered their questions. Discharge instructions concerning home care and prescriptions have been given. The patient is STABLE and is discharged to home in good condition.   Final Clinical Impressions(s) / ED Diagnoses   Final diagnoses:  Yeast vaginitis    New Prescriptions New Prescriptions   FLUCONAZOLE (DIFLUCAN) 150 MG TABLET    Take 1 tablet (150 mg total) by mouth once. TAKE ON 01/26/16 IF SYMPTOMS ARE STILL PRESENT     Vollie Brunty Camprubi-Soms, PA-C 01/23/16 1438    Vanetta Mulders, MD 01/23/16 1545

## 2016-01-23 NOTE — ED Triage Notes (Signed)
Vaginal itching x 3 days

## 2016-01-23 NOTE — ED Notes (Signed)
Patient denies pain and is resting comfortably.  

## 2016-01-23 NOTE — ED Notes (Signed)
States has had vag itching x 3 days, without a d/c or urinary s/s.

## 2016-01-23 NOTE — Discharge Instructions (Signed)
Change back to your old soaps/detergents/lotions/etc. You were treated today for yeast infection, and your symptoms should improve, but if your symptoms are still present on 01/26/16 then take the second tablet of diflucan. If symptoms fully resolve, you do NOT need to take the second pill of diflucan. Follow up with your regular doctor in 1 week for recheck of symptoms. Return to the ER for changes/worsening symptoms.

## 2016-03-03 ENCOUNTER — Encounter (HOSPITAL_BASED_OUTPATIENT_CLINIC_OR_DEPARTMENT_OTHER): Payer: Self-pay | Admitting: *Deleted

## 2016-03-03 ENCOUNTER — Emergency Department (HOSPITAL_BASED_OUTPATIENT_CLINIC_OR_DEPARTMENT_OTHER)
Admission: EM | Admit: 2016-03-03 | Discharge: 2016-03-03 | Disposition: A | Discharge status: Home / Self Care | Payer: Medicaid Other | Attending: Emergency Medicine | Admitting: Emergency Medicine

## 2016-03-03 DIAGNOSIS — Z3A08 8 weeks gestation of pregnancy: Secondary | ICD-10-CM | POA: Diagnosis not present

## 2016-03-03 DIAGNOSIS — Z3491 Encounter for supervision of normal pregnancy, unspecified, first trimester: Secondary | ICD-10-CM

## 2016-03-03 DIAGNOSIS — O99331 Smoking (tobacco) complicating pregnancy, first trimester: Secondary | ICD-10-CM | POA: Diagnosis not present

## 2016-03-03 DIAGNOSIS — O26891 Other specified pregnancy related conditions, first trimester: Secondary | ICD-10-CM | POA: Diagnosis present

## 2016-03-03 DIAGNOSIS — F17200 Nicotine dependence, unspecified, uncomplicated: Secondary | ICD-10-CM | POA: Insufficient documentation

## 2016-03-03 DIAGNOSIS — R109 Unspecified abdominal pain: Secondary | ICD-10-CM | POA: Diagnosis not present

## 2016-03-03 LAB — URINALYSIS, ROUTINE W REFLEX MICROSCOPIC
BILIRUBIN URINE: NEGATIVE
Glucose, UA: NEGATIVE mg/dL
Hgb urine dipstick: NEGATIVE
KETONES UR: NEGATIVE mg/dL
NITRITE: NEGATIVE
PH: 7 (ref 5.0–8.0)
Protein, ur: NEGATIVE mg/dL
Specific Gravity, Urine: 1.013 (ref 1.005–1.030)

## 2016-03-03 LAB — URINE MICROSCOPIC-ADD ON

## 2016-03-03 LAB — PREGNANCY, URINE: Preg Test, Ur: POSITIVE — AB

## 2016-03-03 MED ORDER — PRENATAL COMPLETE 14-0.4 MG PO TABS: 1.0000 | ORAL_TABLET | Freq: Every day | ORAL | 0 refills | Status: AC

## 2016-03-03 NOTE — ED Notes (Signed)
RN called to room by patient. Patient stated she did not want any further testing. Denies having any abd pain and said that "all she wanted was to know for sure and know how far along she is." EDP notified of pts desire to leave without completing ordered testing.

## 2016-03-03 NOTE — ED Provider Notes (Signed)
[facility] Note   CSN: [id number] Arrival date & time: 03/03/16  0008   By signing my name below, I, Christina Maddox, attest that this documentation has been prepared under the direction and in the presence of Christina Maddox Brent Noto, MD . Electronically Signed: Teofilo PodMatthew P. Maddox, ED Scribe. 03/03/2016. 12:57 AM.   History   Chief Complaint Chief Complaint  Patient presents with  . Abdominal Pain    The history is provided by the patient. No language interpreter was used.   HPI Comments:  Christina Maddox is a 20 y.o. female who presents to the Emergency Department complaining of waxing and waning abdominal pain x 3 days. Pt states that the pain is primarily left sided. Pt complains of associated breast tenderness and nausea x 1 day. LNMP was at the end of October 2017. Pt is G2P0A1, and had a positive pregnancy test at the ED this visit. Pt had not used any form of birth control. Pt has been having normal BM. Pt has taken tylenol PM with moderate relief. Pt denies vaginal discharge, vaginal bleeding back pain, vomiting, urinary symptoms.   History reviewed. No pertinent past medical history.  There are no active problems to display for this patient.   History reviewed. No pertinent surgical history.  OB History    No data available       Home Medications    Prior to Admission medications   Not on File    Family History History reviewed. No pertinent family history.  Social History Social History  Substance Use Topics  . Smoking status: Current Every Day Smoker    Packs/day: 0.00  . Smokeless tobacco: Never Used  . Alcohol use Yes     Comment: occ     Allergies   Patient has no known allergies.   Review of Systems Review of Systems  Gastrointestinal: Positive for abdominal pain. Negative for vomiting.  Genitourinary: Negative for dysuria, frequency, hematuria, urgency, vaginal bleeding and vaginal discharge.  Musculoskeletal: Negative for  back pain.     Physical Exam Updated Vital Signs BP 122/74 (BP Location: Left Arm)   Pulse 93   Temp 98.4 F (36.9 C) (Oral)   Resp 18   Ht 5\' 7"  (1.702 m)   Wt 200 lb (90.7 kg)   LMP 12/31/2015   SpO2 100%   BMI 31.32 kg/m   Physical Exam  Constitutional: She is oriented to person, place, and time. She appears well-developed and well-nourished.  HENT:  Head: Normocephalic and atraumatic.  Eyes: EOM are normal. Pupils are equal, round, and reactive to light.  Neck: Normal range of motion. Neck supple. No JVD present.  Cardiovascular: Normal rate, regular rhythm and normal heart sounds.   No murmur heard. Pulmonary/Chest: Effort normal and breath sounds normal. She has no wheezes. She has no rales. She exhibits no tenderness.  Abdominal: Soft. Bowel sounds are normal. She exhibits no distension and no mass. There is no tenderness.  Mild tenderness, left suprapubic area.   Musculoskeletal: Normal range of motion. She exhibits no edema.  Lymphadenopathy:    She has no cervical adenopathy.  Neurological: She is alert and oriented to person, place, and time. No cranial nerve deficit. She exhibits normal muscle tone. Coordination normal.  Skin: Skin is warm and dry. No rash noted.  Psychiatric: She has a normal mood and affect. Her behavior is normal. Judgment and thought content normal.  Nursing note and vitals reviewed.    ED Treatments / Results  DIAGNOSTIC STUDIES:  Oxygen Saturation is 100% on RA, normal by my interpretation.    COORDINATION OF CARE:  12:57 AM Discussed treatment plan with pt at bedside and pt agreed to plan.   Labs (all labs ordered are listed, but only abnormal results are displayed) Labs Reviewed  PREGNANCY, URINE - Abnormal; Notable for the following:       Result Value   Preg Test, Ur POSITIVE (*)    All other components within normal limits  URINALYSIS, ROUTINE W REFLEX MICROSCOPIC (NOT AT [facility]) - Abnormal; Notable for the following:     Leukocytes, UA MODERATE (*)    All other components within normal limits  URINE MICROSCOPIC-ADD ON - Abnormal; Notable for the following:    Squamous Epithelial / LPF 0-5 (*)    Bacteria, UA RARE (*)    All other components within normal limits  RPR  HIV ANTIBODY (ROUTINE TESTING)  HCG, QUANTITATIVE, PREGNANCY  CBC WITH DIFFERENTIAL/PLATELET  ABO/RH  GC/CHLAMYDIA PROBE AMP ([place]) NOT AT [facility]    Procedures Procedures (including critical care time)  Medications Ordered in ED Medications - No data to display   Initial Impression / Assessment and Plan / ED Course  I have reviewed the triage vital signs and the nursing notes.  Pertinent lab results that were available during my care of the patient were reviewed by me and considered in my medical decision making (see chart for details).  Clinical Course    Pelvic pain in early pregnancy. Patient is advised of need for pelvic exam as well as screening for sexually transmitted infections. Quantitative hCG and blood type are ordered. However, when patient discovered that the blood test would not tell her how far along she was, she stated that she did not want to stay and that the only reason she came was to find out how far along she was pregnancy. She denies actually having any pelvic pain. [facility] and is given a prescription for prenatal vitamins.  Final Clinical Impressions(s) / ED Diagnoses   Final diagnoses:  First trimester pregnancy    New Prescriptions New Prescriptions   PRENATAL VIT-FE FUMARATE-FA (PRENATAL COMPLETE) 14-0.4 MG TABS    Take 1 tablet by mouth daily.   I personally performed the services described in this documentation, which was scribed in my presence. The recorded information has been reviewed and is accurate.       Christina Maddox Sache Sane, MD 03/03/16 [phone]

## 2016-03-03 NOTE — ED Triage Notes (Signed)
Abdominal pain with nausea x 3 days.  Normal BM.  LMP: October.  Denies vaginal discharge.

## 2018-07-11 IMAGING — CR DG HAND COMPLETE 3+V*R*
3 series · 3 of 3 positions shown · non-contrast
Comparison: None.

CLINICAL DATA: Patient fell on to broken glass with several small
lacerations in the right hand.

EXAM:
RIGHT HAND - COMPLETE 3+ VIEW

[x hand pa right]
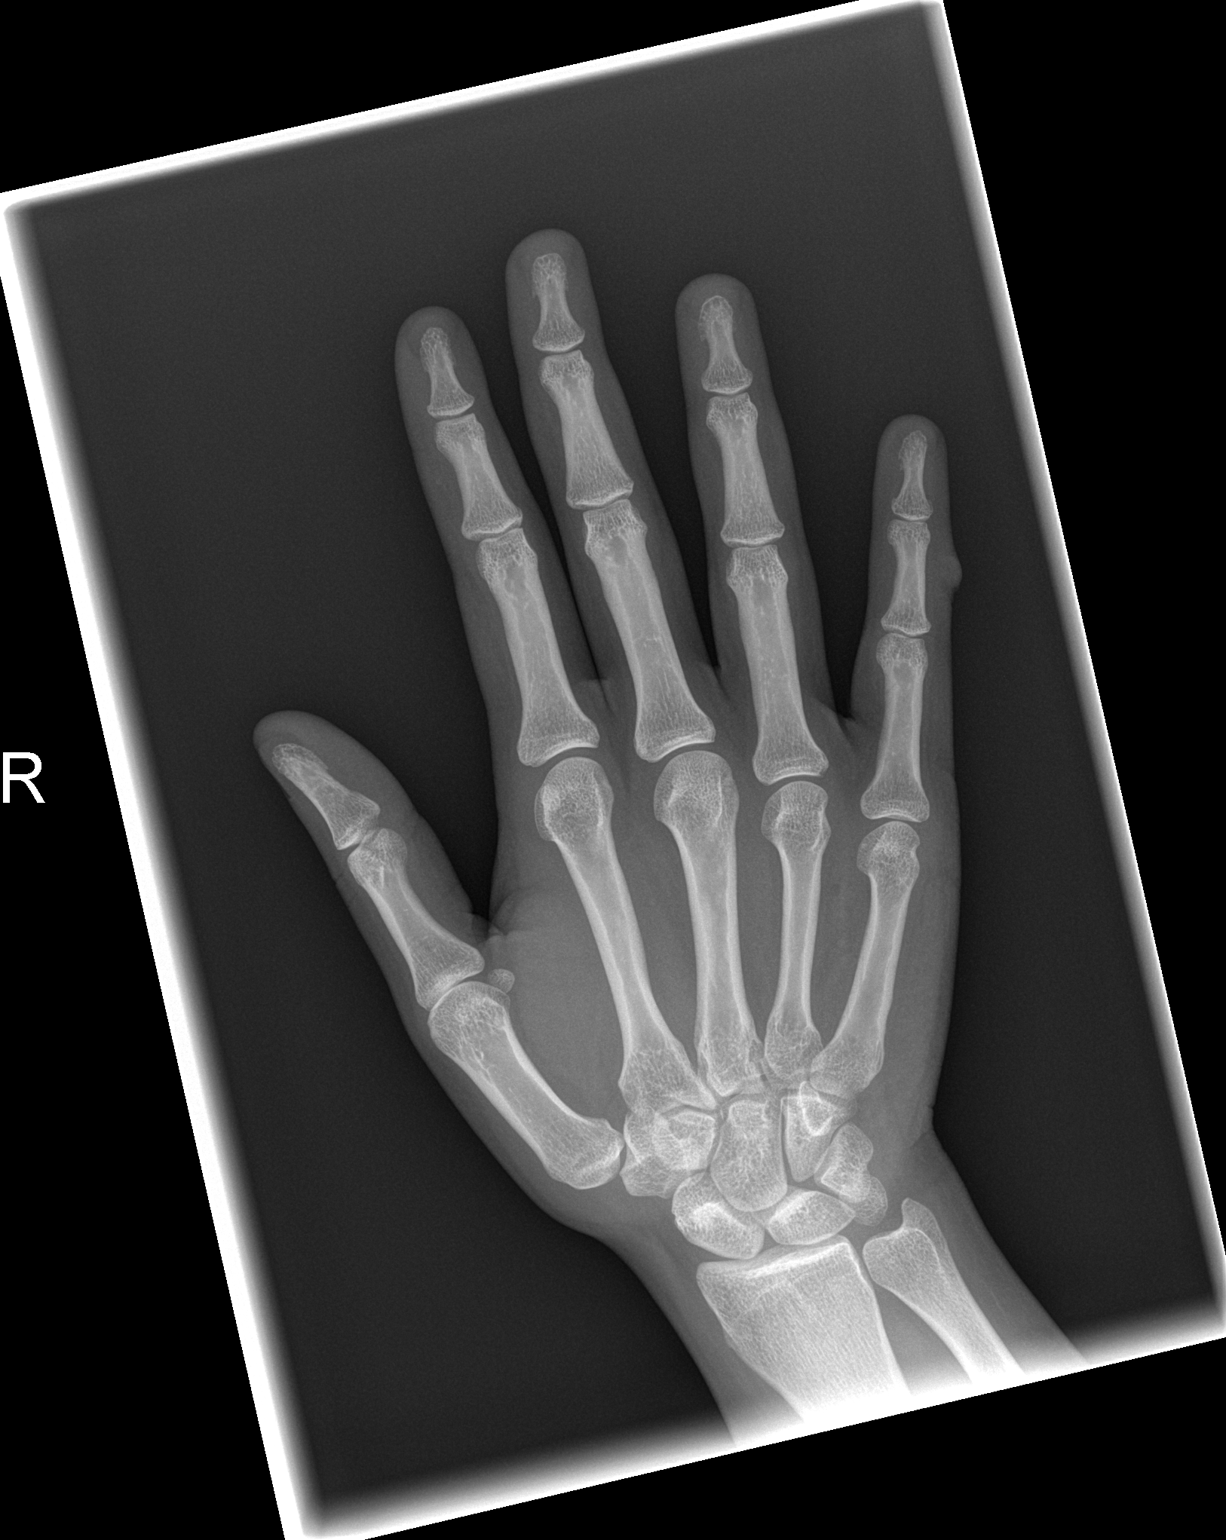

[x hand oblique right]
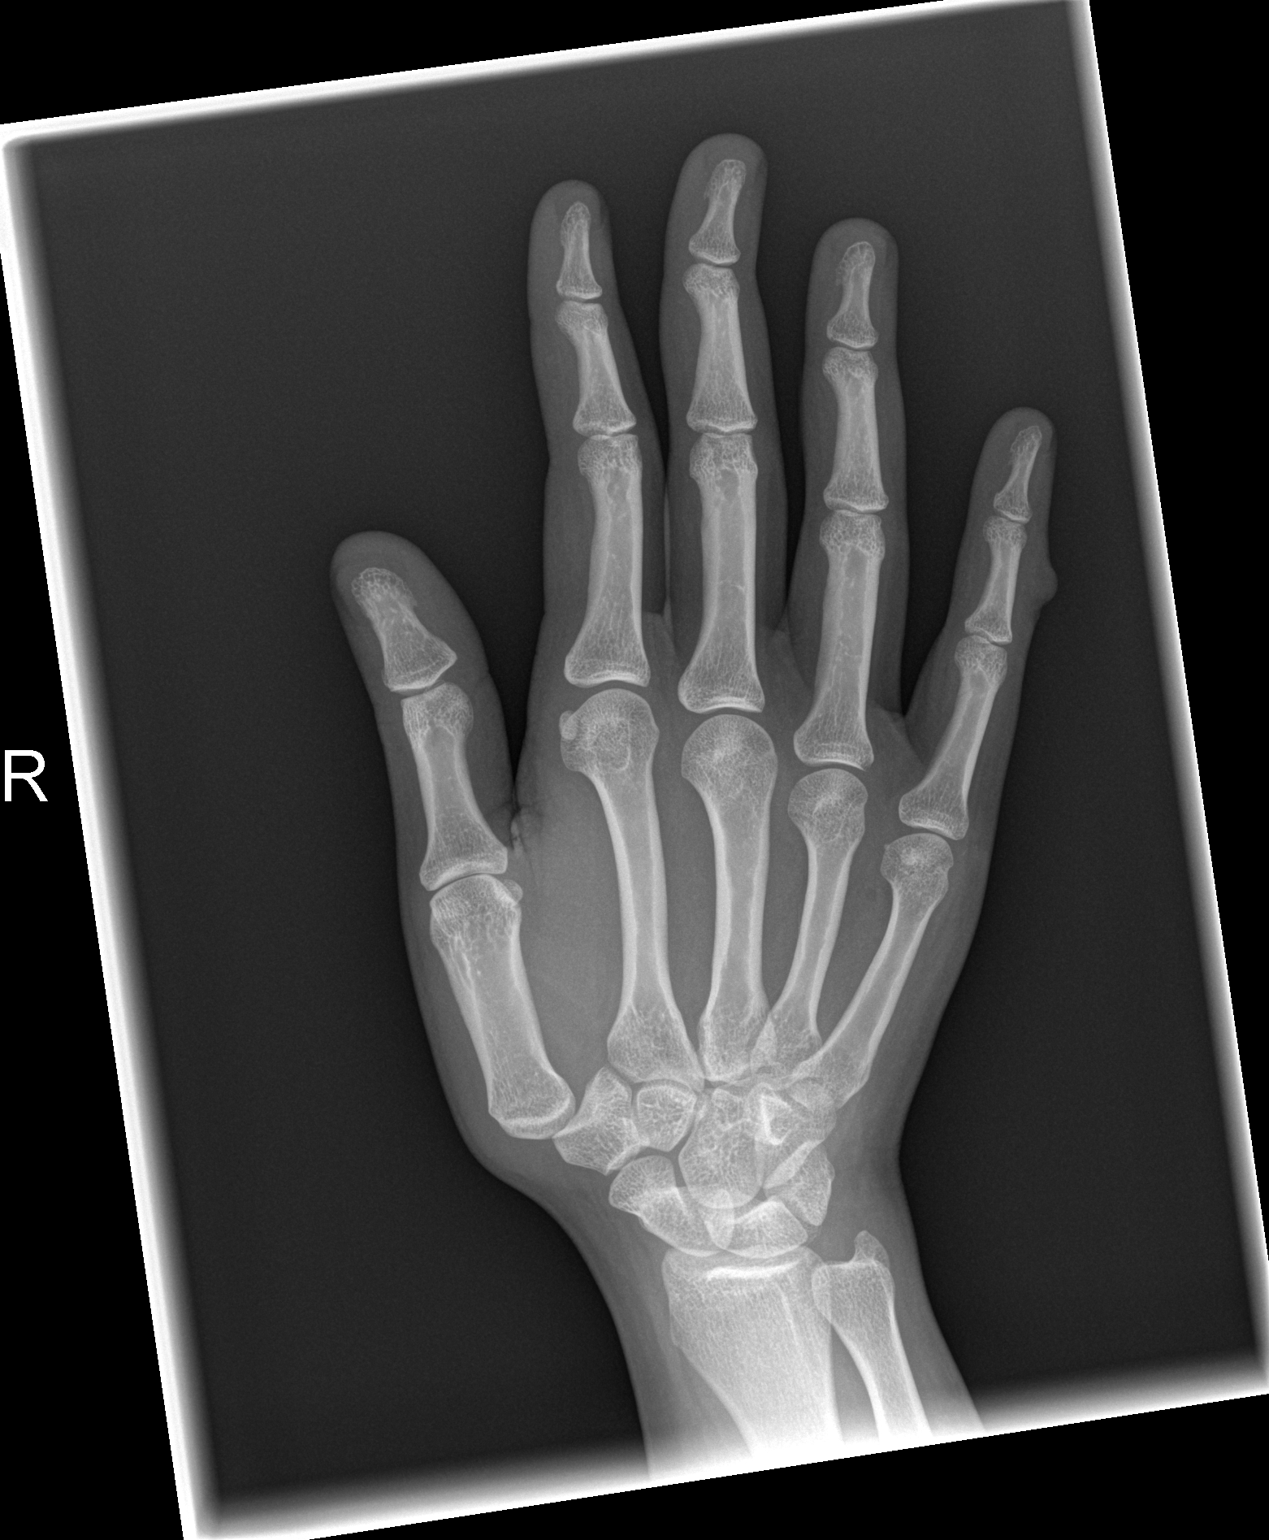

[x hand lat right]
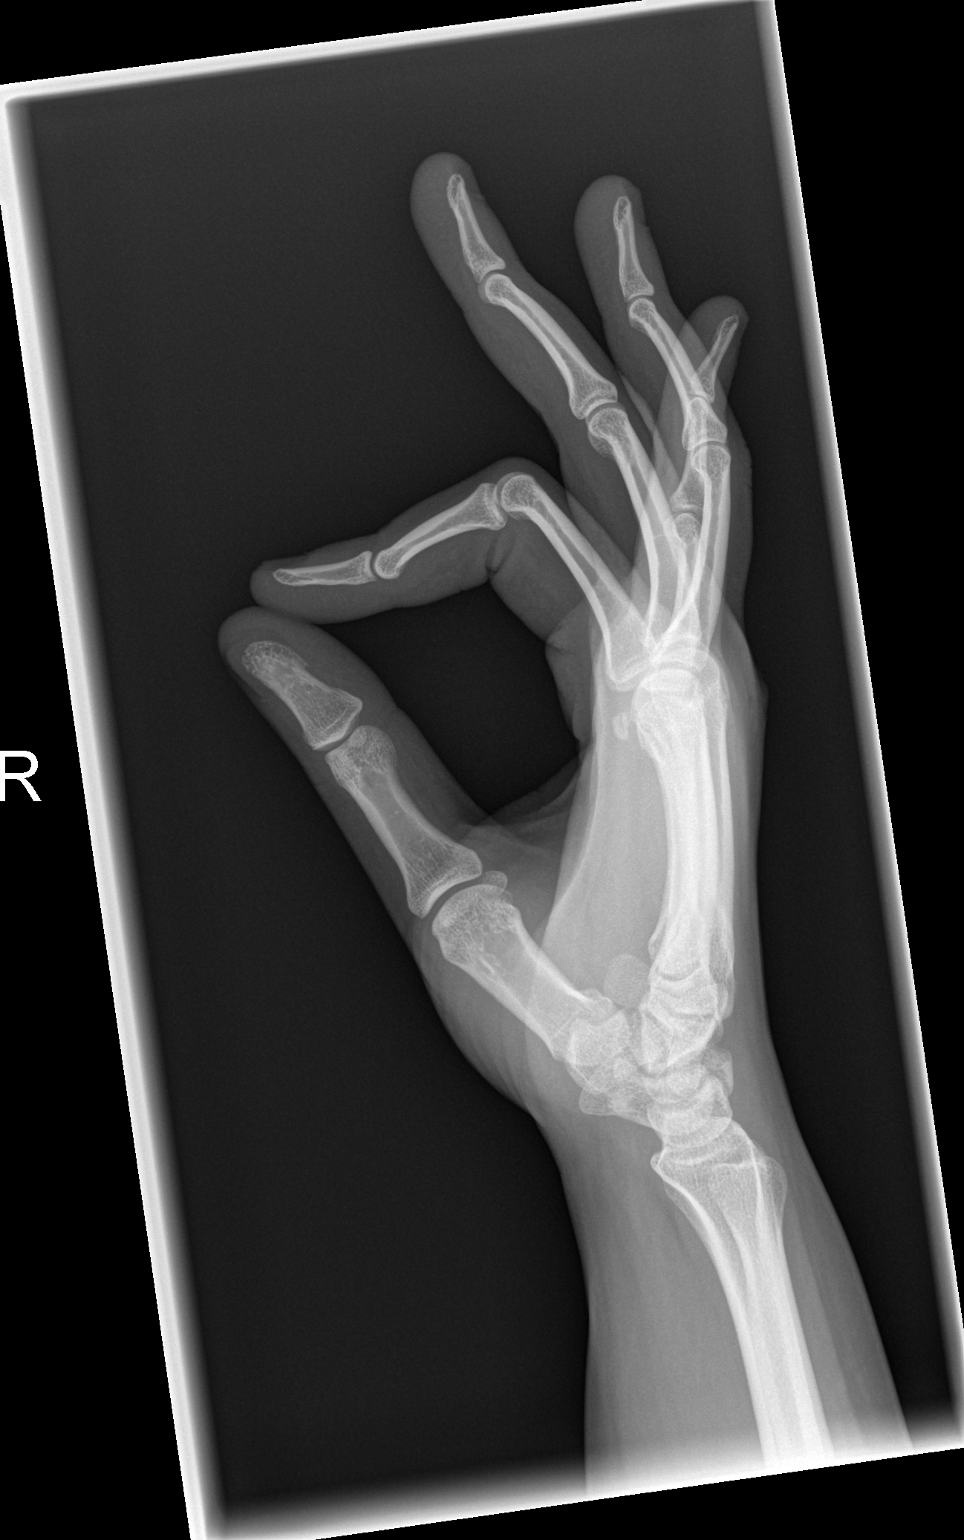

[3 of 3 positions shown; findings below may reference images not displayed]

FINDINGS: Skin defect along the mid right fifth finger could represent a soft
tissue nodule or skin avulsion due to laceration. No radiopaque soft
tissue foreign bodies demonstrated in the right hand. No evidence of
acute fracture or dislocation. No focal bone lesions.
IMPRESSION: No acute bony abnormalities. No soft tissue radiopaque foreign
bodies.
